# Patient Record
Sex: Male | Born: 1960 | Race: Black or African American | Hispanic: No | Smoking: Never smoker
Health system: Southern US, Community
[De-identification: ages and names within clinical notes are randomized; demographics above are authoritative.]

## PROBLEM LIST (undated history)

## (undated) DIAGNOSIS — T7840XA Allergy, unspecified, initial encounter: Secondary | ICD-10-CM

## (undated) HISTORY — DX: Allergy, unspecified, initial encounter: T78.40XA

## (undated) HISTORY — PX: WISDOM TOOTH EXTRACTION: SHX21

## (undated) HISTORY — PX: BICEPS TENDON REPAIR: SHX566

---

## 2006-07-14 ENCOUNTER — Ambulatory Visit: Payer: Self-pay | Admitting: Orthopaedic Surgery

## 2008-12-14 ENCOUNTER — Encounter: Admission: RE | Admit: 2008-12-14 | Discharge: 2008-12-14 | Payer: Self-pay | Admitting: Family Medicine

## 2010-10-08 IMAGING — CT CT EXTREM LOW W/O CM*R*
3 series · 7 of 33 positions shown, 9 images · non-contrast
Comparison: None available.

CLINICAL DATA: Hip pain and swelling.  Question avulsion fracture
of the hip.

CT RIGHT HIP WITHOUT CONTRAST
TECHNIQUE: Multidetector CT imaging of the right hip was performed
according to the standard protocol without intravenous contrast.
Multiplanar CT image reconstructions were also generated.

[Series 4: hip st · axial · 0.28mm/px · z∈[+1061,+1190]mm · 5 of 63 slices shown, 6 images]
[im 10/63  soft-tissue]
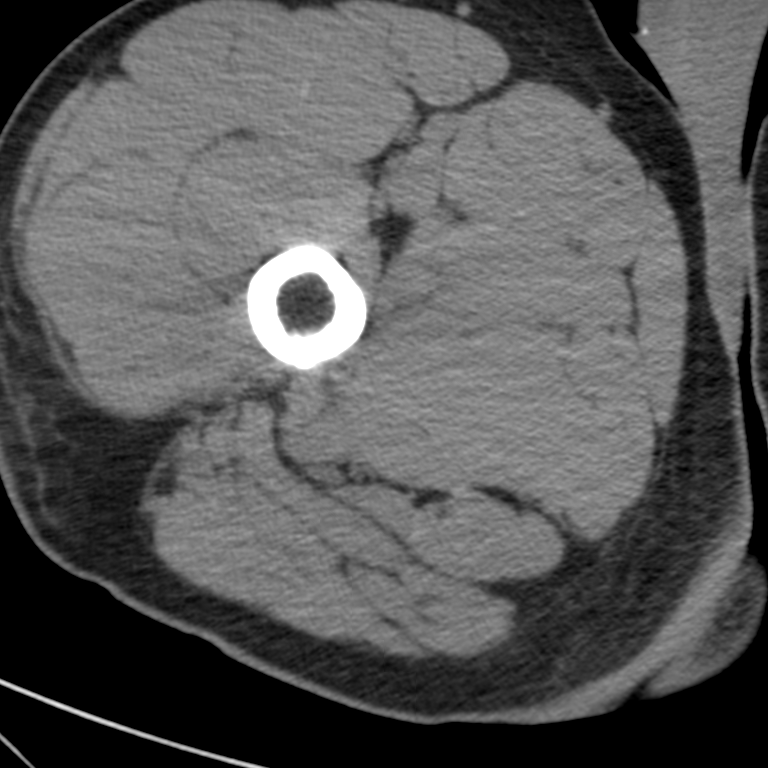
[im 10/63  bone]
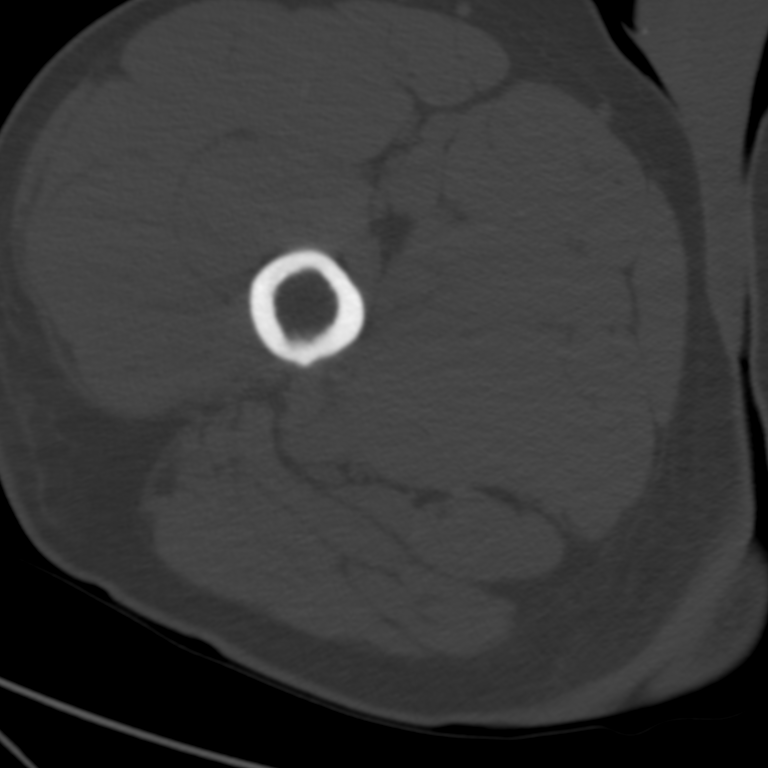
[im 20/63  soft-tissue]
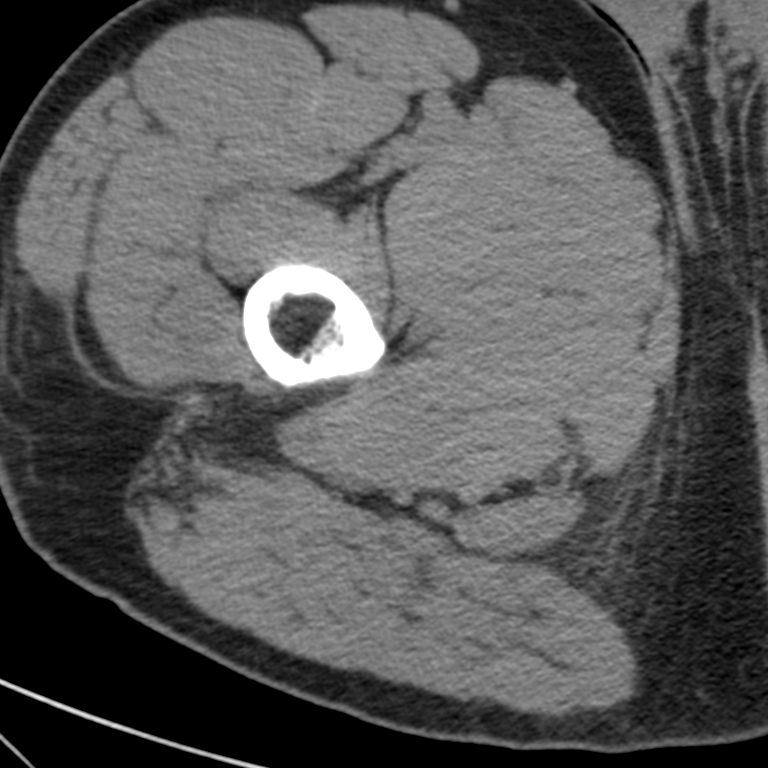
[im 34/63  soft-tissue]
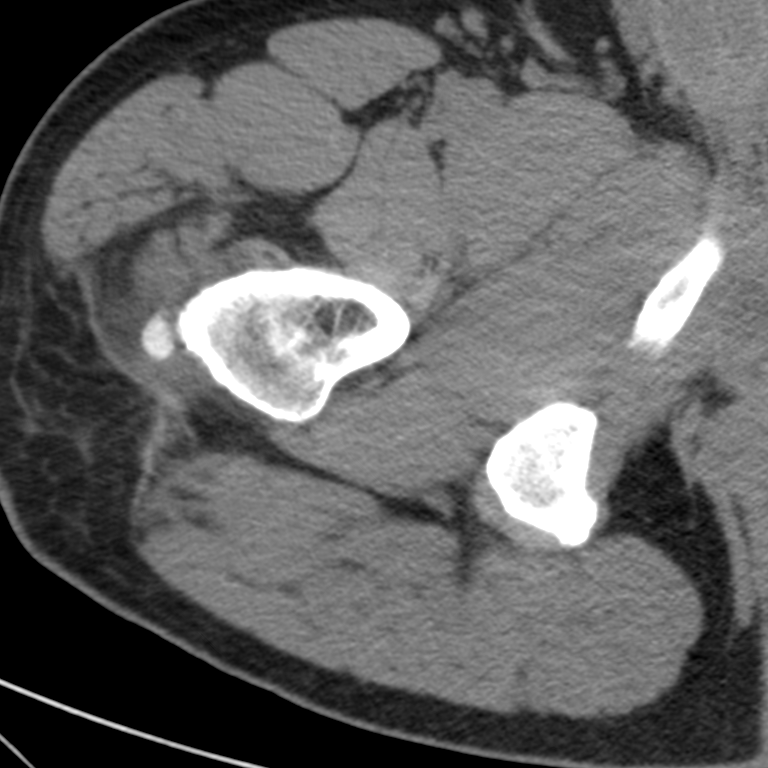
[im 43/63  soft-tissue]
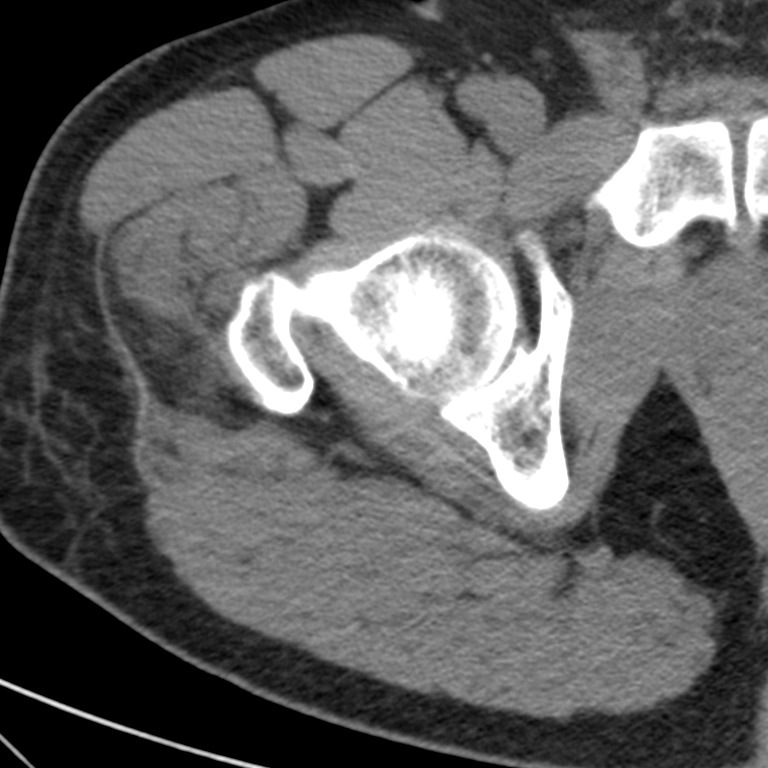
[im 53/63  soft-tissue]
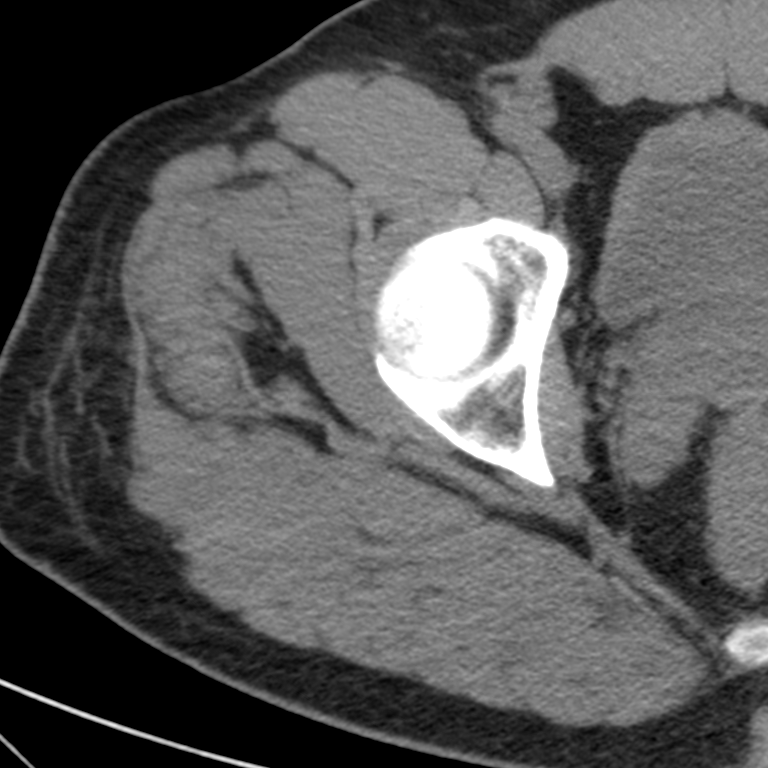

[coronals · coronal · 0.28mm/px · 1 of 62 slices shown]
[im 25/62  bone]
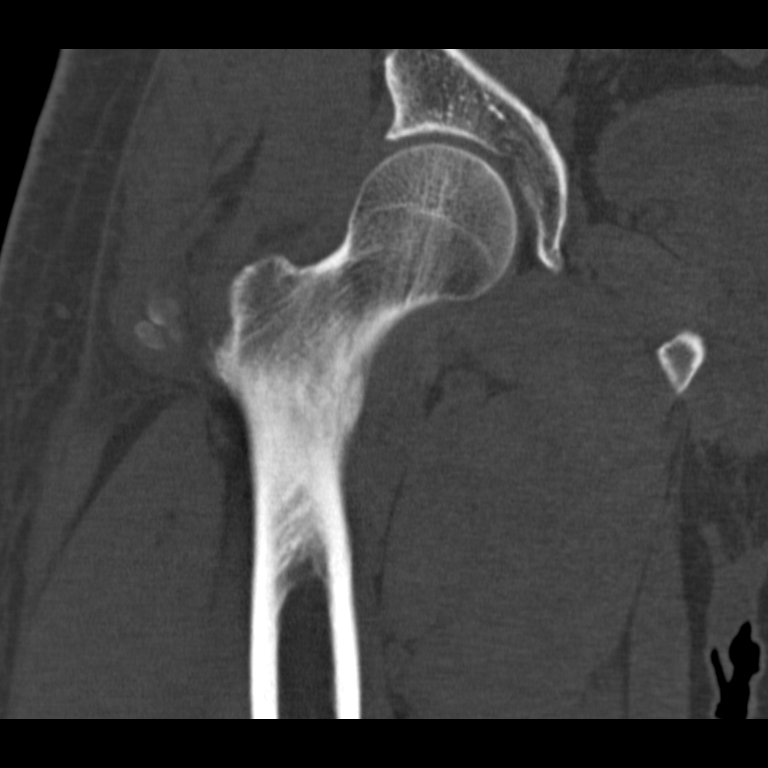

[sags · sagittal · 0.28mm/px · 1 of 86 slices shown, 2 images]
[im 43/86  soft-tissue]
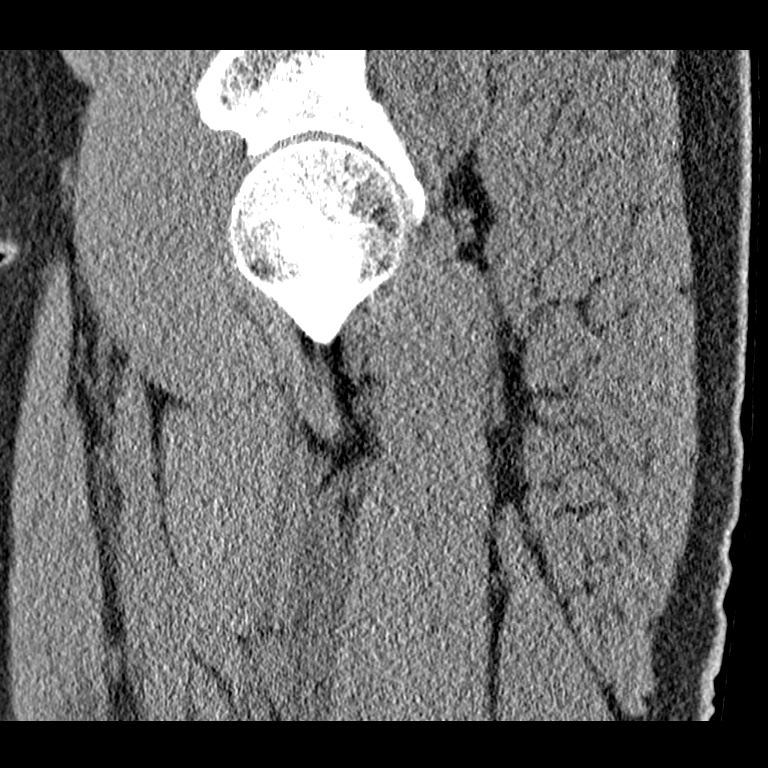
[im 43/86  bone]
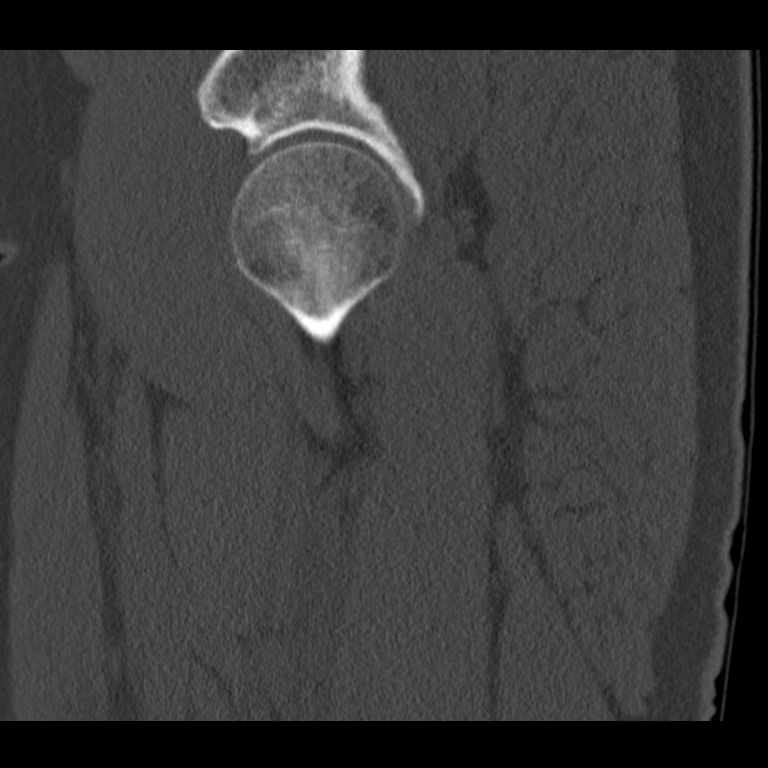

[7 of 33 positions shown; findings below may reference images not displayed]

FINDINGS: Calcifications are present lateral to the right greater
trochanter consistent with calcific bursitis.  There is no gluteal
muscle atrophy or evidence of full-thickness gluteal tendon tear
with retraction.  The iliopsoas muscles and external rotators of
the hip appear intact.  Piriformis is unremarkable.  The visceral
pelvis appears within normal limits.  Left spermatic cord lipoma
incidentally noted.  Mild pubic symphysial degenerative disease.
IMPRESSION: 1.  Calcific bursitis of the right hip greater trochanteric bursa.
2.  No convincing evidence of gluteal tendon tear on CT.  No
muscular atrophy is present.

## 2012-03-29 ENCOUNTER — Ambulatory Visit (INDEPENDENT_AMBULATORY_CARE_PROVIDER_SITE_OTHER): Payer: 59 | Admitting: Internal Medicine

## 2012-03-29 VITALS — BP 123/91 | HR 111 | Temp 102.6°F | Resp 18 | Ht 69.5 in | Wt 234.0 lb

## 2012-03-29 DIAGNOSIS — R05 Cough: Secondary | ICD-10-CM

## 2012-03-29 DIAGNOSIS — J111 Influenza due to unidentified influenza virus with other respiratory manifestations: Secondary | ICD-10-CM

## 2012-03-29 DIAGNOSIS — R509 Fever, unspecified: Secondary | ICD-10-CM

## 2012-03-29 LAB — POCT CBC
Hemoglobin: 15.7 g/dL (ref 14.1–18.1)
Lymph, poc: 1 (ref 0.6–3.4)
MCH, POC: 28.7 pg (ref 27–31.2)
MCV: 92.7 fL (ref 80–97)
MID (cbc): 0.5 (ref 0–0.9)
MPV: 9.2 fL (ref 0–99.8)
POC Granulocyte: 3.3 (ref 2–6.9)
POC MID %: 10.9 %M (ref 0–12)
Platelet Count, POC: 175 10*3/uL (ref 142–424)
RBC: 5.47 M/uL (ref 4.69–6.13)
WBC: 4.8 10*3/uL (ref 4.6–10.2)

## 2012-03-29 MED ORDER — PROMETHAZINE-DM 6.25-15 MG/5ML PO SYRP: 5.0000 mL | ORAL_SOLUTION | Freq: Four times a day (QID) | ORAL | Status: DC | PRN

## 2012-03-29 MED ORDER — OSELTAMIVIR PHOSPHATE 75 MG PO CAPS: 75.0000 mg | ORAL_CAPSULE | Freq: Two times a day (BID) | ORAL | Status: DC

## 2012-03-29 NOTE — Progress Notes (Signed)
  Subjective:    Patient ID: Erik Wheeler, male    DOB: 25-May-1960, 52 y.o.   MRN: [id number]  HPI24 h illness w/ fever, cough-nonprod, aches, chills, sweats No ST No N,V,D Appetite poor   Review of Systems     Objective:   Physical Exam BP 123/91  Pulse 111  Temp 102.6 F (39.2 C)  Resp 18  Ht 5' 9.5" (1.765 m)  Wt 234 lb (106.142 kg)  BMI 34.06 kg/m2  SpO2 97% NAD Tms clear Nares and throat clear Chest clear  Results for orders placed in visit on 03/29/12  POCT INFLUENZA A/B      Component Value Range   Influenza A, POC Negative     Influenza B, POC Negative    POCT CBC      Component Value Range   WBC 4.8  4.6 - 10.2 K/uL   Lymph, poc 1.0  0.6 - 3.4   POC LYMPH PERCENT 20.6  10 - 50 %L   MID (cbc) 0.5  0 - 0.9   POC MID % 10.9  0 - 12 %M   POC Granulocyte 3.3  2 - 6.9   Granulocyte percent 68.5  37 - 80 %G   RBC 5.47  4.69 - 6.13 M/uL   Hemoglobin 15.7  14.1 - 18.1 g/dL   HCT, POC 14.7  82.9 - 53.7 %   MCV 92.7  80 - 97 fL   MCH, POC 28.7  27 - 31.2 pg   MCHC 31.0 (*) 31.8 - 35.4 g/dL   RDW, POC 56.2     Platelet Count, POC 175  142 - 424 K/uL   MPV 9.2  0 - 99.8 fL        Assessment & Plan:   1. Fever  POCT Influenza A/B, POCT CBC  2. Influenza    3. Cough     Meds ordered this encounter  Medications  . oseltamivir (TAMIFLU) 75 MG capsule    Sig: Take 1 capsule (75 mg total) by mouth 2 (two) times daily.    Dispense:  10 capsule    Refill:  0  . promethazine-dextromethorphan (PROMETHAZINE-DM) 6.25-15 MG/5ML syrup    Sig: Take 5 mLs by mouth 4 (four) times daily as needed for cough.    Dispense:  120 mL    Refill:  0

## 2012-03-30 ENCOUNTER — Encounter: Payer: Self-pay | Admitting: Internal Medicine

## 2012-04-04 ENCOUNTER — Encounter: Payer: Self-pay | Admitting: Family Medicine

## 2012-04-04 ENCOUNTER — Ambulatory Visit (INDEPENDENT_AMBULATORY_CARE_PROVIDER_SITE_OTHER): Payer: 59 | Admitting: Family Medicine

## 2012-04-04 VITALS — BP 108/82 | HR 72 | Temp 98.7°F | Resp 16 | Ht 68.0 in | Wt 230.6 lb

## 2012-04-04 DIAGNOSIS — J029 Acute pharyngitis, unspecified: Secondary | ICD-10-CM

## 2012-04-04 DIAGNOSIS — I10 Essential (primary) hypertension: Secondary | ICD-10-CM

## 2012-04-04 DIAGNOSIS — Z23 Encounter for immunization: Secondary | ICD-10-CM

## 2012-04-04 DIAGNOSIS — R509 Fever, unspecified: Secondary | ICD-10-CM

## 2012-04-04 DIAGNOSIS — T7840XA Allergy, unspecified, initial encounter: Secondary | ICD-10-CM

## 2012-04-04 DIAGNOSIS — R21 Rash and other nonspecific skin eruption: Secondary | ICD-10-CM

## 2012-04-04 NOTE — Progress Notes (Signed)
Subjective:    Patient ID: Erik Wheeler, male    DOB: Oct 07, 1960, 52 y.o.   MRN: [id number]  HPI Erik Wheeler is a 52 y.o. malenoted  rx tamiflu and phenergan Dm on 1/19 for ILI, cough, fever, myalgias. Did not take cough syrup - only tamiflu - last dose yesterday.   Noted a rash - few days ago on side of L leg.  Spreading past 2 days, now on L back/chest, both arms.  None on face.  Min itching after bath.  No respiratory symptoms, no throat symptoms.  New bath soap - past 4-5 days. Usually uses [name], now using "[place] with lavendar.  No attempted treatments.   Review of Systems  Constitutional: Negative for fever, chills, activity change, appetite change and unexpected weight change.  HENT: Negative for sore throat, mouth sores and trouble swallowing.   Respiratory: Negative for cough, chest tightness and shortness of breath.   Cardiovascular: Negative for chest pain and leg swelling.  Musculoskeletal: Negative for myalgias and arthralgias.  Skin: Positive for color change and rash.  Hematological: Negative for adenopathy. Does not bruise/bleed easily.       Objective:   Physical Exam  Vitals reviewed. Constitutional: He is oriented to person, place, and time. He appears well-developed and well-nourished.  HENT:  Head: Normocephalic and atraumatic.  Right Ear: Tympanic membrane, external ear and ear canal normal.  Left Ear: Tympanic membrane, external ear and ear canal normal.  Nose: No rhinorrhea.  Mouth/Throat: Oropharynx is clear and moist and mucous membranes are normal. No oropharyngeal exudate or posterior oropharyngeal erythema.       No mucosal lesions.   Eyes: Conjunctivae normal are normal. Pupils are equal, round, and reactive to light.  Neck: Neck supple.  Cardiovascular: Normal rate, regular rhythm, normal heart sounds and intact distal pulses.   No murmur heard. Pulmonary/Chest: Effort normal and breath sounds normal. He has no wheezes. He has no rhonchi. He has no  rales.  Abdominal: Soft. There is no tenderness.  Lymphadenopathy:    He has no cervical adenopathy.  Neurological: He is alert and oriented to person, place, and time.  Skin: Skin is warm and dry.     Psychiatric: He has a normal mood and affect. His behavior is normal.          Assessment & Plan:  Erik Wheeler is a 52 y.o. male 1. Rash   2. Allergic reaction    Possible allergic rxn to soap or less likely Tamifliu, but few herald - appearing patches - ddx includes pityriaisis or other viral exanthem as not very pruritic.  Sx care for now, with rtc precautions as below.   Patient Instructions  This rash my be an allergic reaction or a viral syndrome - sometimes seen after a virus like you had.  You can take benadryl over the counter every 4-6 hours as needed, zantac 150mg  twice per day (another type of antihistamine), and switch back to [name] soap.  If any further spread of rash, or increased itching - may need to take prednisone - call or return to clinic to discuss.  If any mouth lesions, or worsening recheck here or the emergency room.   Return to the clinic or go to the nearest emergency room if any of your symptoms worsen or new symptoms occur.  Allergic Reaction, Mild to Moderate Allergies may happen from anything your body is sensitive to. This may be food, medications, pollens, chemicals, and nearly anything around you in everyday life  that produces allergens. An allergen is anything that causes an allergy producing substance. Allergens cause your body to release allergic antibodies. Through a chain of events, they cause a release of histamine into the blood stream. Histamines are meant to protect you, but they also cause your discomfort. This is why antihistamines are often used for allergies. Heredity is often a factor in causing allergic reactions. This means you may have some of the same allergies as your parents. Allergies happen in all age groups. You may have some idea of what  caused your reaction. There are many allergens around [place]. It may be difficult to know what caused your reaction. If this is a first time event, it may never happen again. Allergies cannot be cured but can be controlled with medications. SYMPTOMS  You may get some or all of the following problems from allergies.  Swelling and itching in and around the mouth.   Tearing, itchy eyes.   Nasal congestion and runny nose.   Sneezing and coughing.   An itchy red rash or hives.   Vomiting or diarrhea.   Difficulty breathing.  Seasonal allergies occur in all age groups. They are seasonal because they usually occur during the same season every year. They may be a reaction to molds, grass pollens, or tree pollens. Other causes of allergies are house dust mite allergens, pet dander and mold spores. These are just a common few of the thousands of allergens around [place]. All of the symptoms listed above happen when you come in contact with pollens and other allergens. Seasonal allergies are usually not life threatening. They are generally more of a nuisance that can often be handled using medications. Hay fever is a combination of all or some of the above listed allergy problems. It may often be treated with simple over-the-counter medications such as diphenhydramine. Take medication as directed. Check with your caregiver or package insert for child dosages. TREATMENT AND HOME CARE INSTRUCTIONS If hives or rash are present:  Take medications as directed.   You may use an over-the-counter antihistamine (diphenhydramine) for hives and itching as needed. Do not drive or drink alcohol until medications used to treat the reaction have worn off. Antihistamines tend to make people sleepy.   Apply cold cloths (compresses) to the skin or take baths in cool water. This will help itching. Avoid hot baths or showers. Heat will make a rash and itching worse.   If your allergies persist and become more severe, and over  the counter medications are not effective, there are many new medications your caretaker can prescribe. Immunotherapy or desensitizing injections can be used if all else fails. Follow up with your caregiver if problems continue.  SEEK MEDICAL CARE IF:   Your allergies are becoming progressively more troublesome.   You suspect a food allergy. Symptoms generally happen within 30 minutes of eating a food.   Your symptoms have not gone away within 2 days or are getting worse.   You develop new symptoms.   You want to retest yourself or your child with a food or drink you think causes an allergic reaction. Never test yourself or your child of a suspected allergy without being under the watchful eye of your caregivers. A second exposure to an allergen may be life-threatening.  SEEK IMMEDIATE MEDICAL CARE IF:  You develop difficulty breathing or wheezing, or have a tight feeling in your chest or throat.   You develop a swollen mouth, hives, swelling, or itching all over your  body.  A severe reaction with any of the above problems should be considered life-threatening. If you suddenly develop difficulty breathing call for local emergency medical help. THIS IS AN EMERGENCY. MAKE SURE YOU:   Understand these instructions.   Will watch your condition.   Will get help right away if you are not doing well or get worse.  Document Released: 12/23/2006 Document Revised: 02/14/2011 Document Reviewed: 12/23/2006 [facility] Patient Information 2012 [place], [place].

## 2012-04-04 NOTE — Patient Instructions (Signed)
This rash my be an allergic reaction or a viral syndrome - sometimes seen after a virus like you had.  You can take benadryl over the counter every 4-6 hours as needed, zantac 150mg  twice per day (another type of antihistamine), and switch back to [name] soap.  If any further spread of rash, or increased itching - may need to take prednisone - call or return to clinic to discuss.  If any mouth lesions, or worsening recheck here or the emergency room.   Return to the clinic or go to the nearest emergency room if any of your symptoms worsen or new symptoms occur.  Allergic Reaction, Mild to Moderate Allergies may happen from anything your body is sensitive to. This may be food, medications, pollens, chemicals, and nearly anything around you in everyday life that produces allergens. An allergen is anything that causes an allergy producing substance. Allergens cause your body to release allergic antibodies. Through a chain of events, they cause a release of histamine into the blood stream. Histamines are meant to protect you, but they also cause your discomfort. This is why antihistamines are often used for allergies. Heredity is often a factor in causing allergic reactions. This means you may have some of the same allergies as your parents. Allergies happen in all age groups. You may have some idea of what caused your reaction. There are many allergens around [place]. It may be difficult to know what caused your reaction. If this is a first time event, it may never happen again. Allergies cannot be cured but can be controlled with medications. SYMPTOMS  You may get some or all of the following problems from allergies.  Swelling and itching in and around the mouth.   Tearing, itchy eyes.   Nasal congestion and runny nose.   Sneezing and coughing.   An itchy red rash or hives.   Vomiting or diarrhea.   Difficulty breathing.  Seasonal allergies occur in all age groups. They are seasonal because they usually  occur during the same season every year. They may be a reaction to molds, grass pollens, or tree pollens. Other causes of allergies are house dust mite allergens, pet dander and mold spores. These are just a common few of the thousands of allergens around [place]. All of the symptoms listed above happen when you come in contact with pollens and other allergens. Seasonal allergies are usually not life threatening. They are generally more of a nuisance that can often be handled using medications. Hay fever is a combination of all or some of the above listed allergy problems. It may often be treated with simple over-the-counter medications such as diphenhydramine. Take medication as directed. Check with your caregiver or package insert for child dosages. TREATMENT AND HOME CARE INSTRUCTIONS If hives or rash are present:  Take medications as directed.   You may use an over-the-counter antihistamine (diphenhydramine) for hives and itching as needed. Do not drive or drink alcohol until medications used to treat the reaction have worn off. Antihistamines tend to make people sleepy.   Apply cold cloths (compresses) to the skin or take baths in cool water. This will help itching. Avoid hot baths or showers. Heat will make a rash and itching worse.   If your allergies persist and become more severe, and over the counter medications are not effective, there are many new medications your caretaker can prescribe. Immunotherapy or desensitizing injections can be used if all else fails. Follow up with your caregiver if problems continue.  SEEK MEDICAL CARE IF:   Your allergies are becoming progressively more troublesome.   You suspect a food allergy. Symptoms generally happen within 30 minutes of eating a food.   Your symptoms have not gone away within 2 days or are getting worse.   You develop new symptoms.   You want to retest yourself or your child with a food or drink you think causes an allergic reaction.  Never test yourself or your child of a suspected allergy without being under the watchful eye of your caregivers. A second exposure to an allergen may be life-threatening.  SEEK IMMEDIATE MEDICAL CARE IF:  You develop difficulty breathing or wheezing, or have a tight feeling in your chest or throat.   You develop a swollen mouth, hives, swelling, or itching all over your body.  A severe reaction with any of the above problems should be considered life-threatening. If you suddenly develop difficulty breathing call for local emergency medical help. THIS IS AN EMERGENCY. MAKE SURE YOU:   Understand these instructions.   Will watch your condition.   Will get help right away if you are not doing well or get worse.  Document Released: 12/23/2006 Document Revised: 02/14/2011 Document Reviewed: 12/23/2006 [facility] Patient Information 2012 [place], [place].

## 2013-11-17 ENCOUNTER — Ambulatory Visit (INDEPENDENT_AMBULATORY_CARE_PROVIDER_SITE_OTHER): Payer: 59 | Admitting: Family Medicine

## 2013-11-17 ENCOUNTER — Ambulatory Visit (INDEPENDENT_AMBULATORY_CARE_PROVIDER_SITE_OTHER): Payer: 59

## 2013-11-17 VITALS — BP 112/70 | HR 63 | Temp 98.0°F | Resp 16 | Ht 69.0 in | Wt 237.0 lb

## 2013-11-17 DIAGNOSIS — T148XXA Other injury of unspecified body region, initial encounter: Secondary | ICD-10-CM

## 2013-11-17 DIAGNOSIS — R1032 Left lower quadrant pain: Secondary | ICD-10-CM

## 2013-11-17 DIAGNOSIS — R109 Unspecified abdominal pain: Secondary | ICD-10-CM

## 2013-11-17 MED ORDER — CYCLOBENZAPRINE HCL 5 MG PO TABS: 5.0000 mg | ORAL_TABLET | Freq: Every evening | ORAL | Status: DC | PRN

## 2013-11-17 NOTE — Progress Notes (Signed)
Chief Complaint:  Chief Complaint  Patient presents with  . Groin Pain    this morning pt was lifting something at the gym and hurt his groin on the left side    HPI: Erik Wheeler is a 53 y.o. male who is here for  Acute left groin pain after doing squats with 130 lbs of weight on bar and heard a pop when he was going down. He was fine prior to this and has done weights and used to working with 300 lbs weights. There is no swelling but he has pain in the left groin area and he has difficult moving his leg on its own, he needs to lift his leg up to move it , hard for him to sit. He has no uriantion problems. He does not have any abd pain, nausea or vomiting. He had problems getting out of the car since had to pull his leg up to help himself get out. It is achey, constant, when he is moving it is 8/10 when he is trying to get out of car. He has discomofrt when he is sitting. He has not tried anything for this. No history of inguinal hernias. He has no steroid or fluoroquinolone/abx use. He has no numbness or tingling. Has been using Alive vitamin, this week he took saw palmetto and fish oil.  No urinary incontinence. He had biceps tendon repair in [place], similar feeling to 6-7 years ago. Again no steroid use or autoimmune disease   Past Medical History  Diagnosis Date  . Allergy    Past Surgical History  Procedure Laterality Date  . Wisdom tooth extraction    . Biceps tendon repair     History   Social History  . Marital Status: Unknown    Spouse Name: N/A    Number of Children: N/A  . Years of Education: N/A   Social History Main Topics  . Smoking status: Never Smoker   . Smokeless tobacco: None  . Alcohol Use: No  . Drug Use: No  . Sexual Activity: Yes   Other Topics Concern  . None   Social History Narrative  . None   History reviewed. No pertinent family history. Allergies  Allergen Reactions  . Amoxicillin Other (See Comments)    Pt does not remember  .  Vicodin [Hydrocodone-Acetaminophen]    Prior to Admission medications   Not on File     ROS: The patient denies fevers, chills, night sweats, unintentional weight loss, chest pain, palpitations, wheezing, dyspnea on exertion, nausea, vomiting, abdominal pain, dysuria, hematuria, melena, numbness, weakness, or tingling.   All other systems have been reviewed and were otherwise negative with the exception of those mentioned in the HPI and as above.    PHYSICAL EXAM: Filed Vitals:   11/17/13 1741  BP: 112/70  Pulse: 63  Temp: 98 F (36.7 C)  Resp: 16   Filed Vitals:   11/17/13 1741  Height:  (1.753 m)  Weight: 237 lb (107.502 kg)   Body mass index is 34.98 kg/(m^2).  General: Alert, no acute distress HEENT:  Normocephalic, atraumatic, oropharynx patent. EOMI, PERRLA Cardiovascular:  Regular rate and rhythm, no rubs murmurs or gallops.  No Carotid bruits, radial pulse intact. No pedal edema.  Respiratory: Clear to auscultation bilaterally.  No wheezes, rales, or rhonchi.  No cyanosis, no use of accessory musculature GI: No organomegaly, abdomen is soft and non-tender, positive bowel sounds.  No masses. Skin: No rashes. Neurologic: Facial  musculature symmetric. Psychiatric: Patient is appropriate throughout our interaction. Lymphatic: No cervical lymphadenopathy Musculoskeletal: Gait antalgic 5/5 strength Decrease ROM in adduction and abduction of left quad He has full sensation 2/2 ankle and knee reflexes Straight leg neg Neg saddle anesthesia   LABS: Results for orders placed in visit on 03/29/12  POCT INFLUENZA A/B      Result Value Ref Range   Influenza A, POC Negative     Influenza B, POC Negative    POCT CBC      Result Value Ref Range   WBC 4.8  4.6 - 10.2 K/uL   Lymph, poc 1.0  0.6 - 3.4   POC LYMPH PERCENT 20.6  10 - 50 %L   MID (cbc) 0.5  0 - 0.9   POC MID % 10.9  0 - 12 %M   POC Granulocyte 3.3  2 - 6.9   Granulocyte percent 68.5  37 - 80 %G   RBC  5.47  4.69 - 6.13 M/uL   Hemoglobin 15.7  14.1 - 18.1 g/dL   HCT, POC 40.9  81.1 - 53.7 %   MCV 92.7  80 - 97 fL   MCH, POC 28.7  27 - 31.2 pg   MCHC 31.0 (*) 31.8 - 35.4 g/dL   RDW, POC 91.4     Platelet Count, POC 175  142 - 424 K/uL   MPV 9.2  0 - 99.8 fL     EKG/XRAY:   Primary read interpreted by Dr. Conley Rolls at [facility]. Neg for fracture or dislocation   ASSESSMENT/PLAN: Encounter Diagnoses  Name Primary?  . Groin pain, left Yes  . Sprain and strain    He has a slight reducible left inguinal hernia but he has msk pain with adduction and abduction ? Vastus medialis strain/sprain/msk contusion Ibuprofen prn, decline stronger pain meds Refer to ortho per pt request, he would like to be seen this week since he had similar sxs with biceps tendon rupture, refer to whoever can see him at [facility] if not then whoever is available F/u prn   Gross sideeffects, risk and benefits, and alternatives of medications d/w patient. Patient is aware that all medications have potential sideeffects and we are unable to predict every sideeffect or drug-drug interaction that may occur.  Hamilton Capri PHUONG, DO 11/17/2013 7:21 PM

## 2015-09-11 IMAGING — CR DG PELVIS 1-2V
1 series · 1 of 1 positions shown · non-contrast
Comparison: None.

CLINICAL DATA: Acute left groin pain after injury.

EXAM:
PELVIS - 1-2 VIEW

[AP]
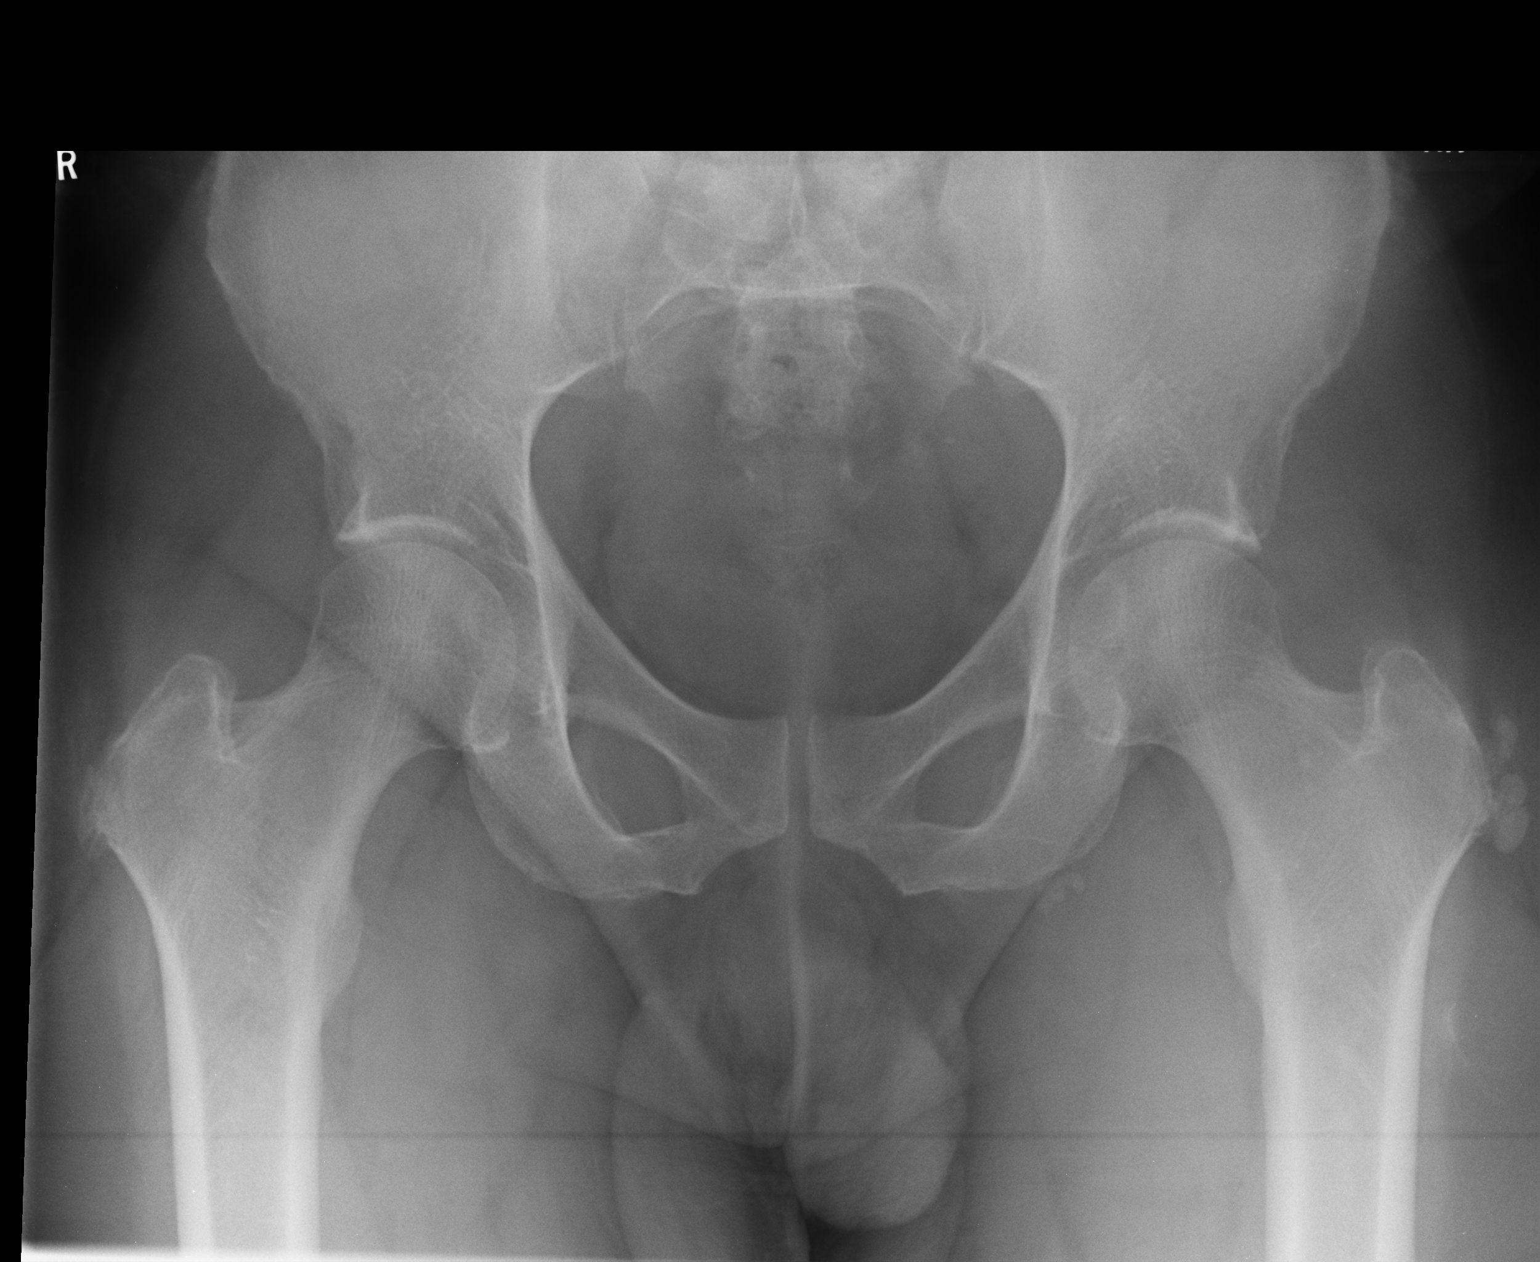

[1 of 1 positions shown; findings below may reference images not displayed]

FINDINGS: Mild degenerative changes in both hips. Heterotopic calcification or
on the left greater trochanter. No fracture or dislocation of the
pelvis or hips. SI joints and symphysis pubis are intact.
IMPRESSION: No acute bony abnormalities.

## 2017-12-18 DIAGNOSIS — R229 Localized swelling, mass and lump, unspecified: Secondary | ICD-10-CM | POA: Diagnosis not present

## 2019-01-30 DIAGNOSIS — I959 Hypotension, unspecified: Secondary | ICD-10-CM | POA: Diagnosis not present

## 2019-01-30 DIAGNOSIS — R55 Syncope and collapse: Secondary | ICD-10-CM | POA: Diagnosis not present

## 2019-02-11 DIAGNOSIS — S76211A Strain of adductor muscle, fascia and tendon of right thigh, initial encounter: Secondary | ICD-10-CM | POA: Diagnosis not present

## 2019-03-11 DIAGNOSIS — H40033 Anatomical narrow angle, bilateral: Secondary | ICD-10-CM | POA: Diagnosis not present

## 2019-03-11 DIAGNOSIS — H04123 Dry eye syndrome of bilateral lacrimal glands: Secondary | ICD-10-CM | POA: Diagnosis not present

## 2019-04-07 ENCOUNTER — Ambulatory Visit: Payer: BC Managed Care – PPO | Attending: Internal Medicine

## 2019-04-07 DIAGNOSIS — Z20822 Contact with and (suspected) exposure to covid-19: Secondary | ICD-10-CM

## 2019-04-08 LAB — NOVEL CORONAVIRUS, NAA: SARS-CoV-2, NAA: DETECTED — AB

## 2019-07-12 DIAGNOSIS — R070 Pain in throat: Secondary | ICD-10-CM | POA: Diagnosis not present

## 2019-07-12 DIAGNOSIS — R590 Localized enlarged lymph nodes: Secondary | ICD-10-CM | POA: Diagnosis not present

## 2019-09-21 DIAGNOSIS — Z0000 Encounter for general adult medical examination without abnormal findings: Secondary | ICD-10-CM | POA: Diagnosis not present

## 2019-09-21 DIAGNOSIS — Z125 Encounter for screening for malignant neoplasm of prostate: Secondary | ICD-10-CM | POA: Diagnosis not present

## 2019-09-21 DIAGNOSIS — E669 Obesity, unspecified: Secondary | ICD-10-CM | POA: Diagnosis not present

## 2019-09-21 DIAGNOSIS — Z13220 Encounter for screening for lipoid disorders: Secondary | ICD-10-CM | POA: Diagnosis not present

## 2020-10-06 DIAGNOSIS — E7800 Pure hypercholesterolemia, unspecified: Secondary | ICD-10-CM | POA: Diagnosis not present

## 2020-10-06 DIAGNOSIS — Z0000 Encounter for general adult medical examination without abnormal findings: Secondary | ICD-10-CM | POA: Diagnosis not present

## 2020-10-06 DIAGNOSIS — Z125 Encounter for screening for malignant neoplasm of prostate: Secondary | ICD-10-CM | POA: Diagnosis not present

## 2020-10-06 DIAGNOSIS — Z23 Encounter for immunization: Secondary | ICD-10-CM | POA: Diagnosis not present

## 2020-12-29 DIAGNOSIS — Z23 Encounter for immunization: Secondary | ICD-10-CM | POA: Diagnosis not present

## 2021-11-22 ENCOUNTER — Emergency Department (HOSPITAL_COMMUNITY): Payer: 59

## 2021-11-22 ENCOUNTER — Encounter (HOSPITAL_COMMUNITY): Payer: Self-pay | Admitting: Emergency Medicine

## 2021-11-22 ENCOUNTER — Emergency Department (HOSPITAL_COMMUNITY)
Admission: EM | Admit: 2021-11-22 | Discharge: 2021-11-22 | Disposition: A | Discharge status: Home / Self Care | Payer: 59 | Attending: Emergency Medicine | Admitting: Emergency Medicine

## 2021-11-22 DIAGNOSIS — Z20822 Contact with and (suspected) exposure to covid-19: Secondary | ICD-10-CM | POA: Diagnosis not present

## 2021-11-22 DIAGNOSIS — R531 Weakness: Secondary | ICD-10-CM | POA: Insufficient documentation

## 2021-11-22 DIAGNOSIS — R000 Tachycardia, unspecified: Secondary | ICD-10-CM | POA: Insufficient documentation

## 2021-11-22 DIAGNOSIS — N3001 Acute cystitis with hematuria: Secondary | ICD-10-CM

## 2021-11-22 DIAGNOSIS — R509 Fever, unspecified: Secondary | ICD-10-CM | POA: Diagnosis present

## 2021-11-22 LAB — COMPREHENSIVE METABOLIC PANEL
ALT: 44 U/L (ref 0–44)
AST: 45 U/L — ABNORMAL HIGH (ref 15–41)
Albumin: 3.5 g/dL (ref 3.5–5.0)
Alkaline Phosphatase: 58 U/L (ref 38–126)
Anion gap: 8 (ref 5–15)
BUN: 34 mg/dL — ABNORMAL HIGH (ref 8–23)
CO2: 23 mmol/L (ref 22–32)
Calcium: 9.3 mg/dL (ref 8.9–10.3)
Chloride: 101 mmol/L (ref 98–111)
Creatinine, Ser: 1.7 mg/dL — ABNORMAL HIGH (ref 0.61–1.24)
GFR, Estimated: 45 mL/min — ABNORMAL LOW (ref >60)
Glucose, Bld: 124 mg/dL — ABNORMAL HIGH (ref 70–99)
Potassium: 4.4 mmol/L (ref 3.5–5.1)
Sodium: 132 mmol/L — ABNORMAL LOW (ref 135–145)
Total Bilirubin: 1.6 mg/dL — ABNORMAL HIGH (ref 0.3–1.2)
Total Protein: 8.3 g/dL — ABNORMAL HIGH (ref 6.5–8.1)

## 2021-11-22 LAB — URINALYSIS, ROUTINE W REFLEX MICROSCOPIC
Bilirubin Urine: NEGATIVE
Glucose, UA: NEGATIVE mg/dL
Ketones, ur: NEGATIVE mg/dL
Nitrite: POSITIVE — AB
Protein, ur: 100 mg/dL — AB
RBC / HPF: >50 RBC/hpf — ABNORMAL HIGH (ref 0–5)
Specific Gravity, Urine: 1.02 (ref 1.005–1.030)
WBC, UA: >50 WBC/hpf — ABNORMAL HIGH (ref 0–5)
pH: 5 (ref 5.0–8.0)

## 2021-11-22 LAB — RESP PANEL BY RT-PCR (FLU A&B, COVID) ARPGX2
Influenza A by PCR: NEGATIVE
Influenza B by PCR: NEGATIVE
SARS Coronavirus 2 by RT PCR: NEGATIVE

## 2021-11-22 LAB — CBC WITH DIFFERENTIAL/PLATELET
Abs Immature Granulocytes: 0.05 10*3/uL (ref 0.00–0.07)
Basophils Absolute: 0 10*3/uL (ref 0.0–0.1)
Basophils Relative: 0 %
Eosinophils Absolute: 0 10*3/uL (ref 0.0–0.5)
Eosinophils Relative: 0 %
HCT: 49.7 % (ref 39.0–52.0)
Hemoglobin: 16.5 g/dL (ref 13.0–17.0)
Immature Granulocytes: 1 %
Lymphocytes Relative: 8 %
Lymphs Abs: 0.7 10*3/uL (ref 0.7–4.0)
MCH: 29.8 pg (ref 26.0–34.0)
MCHC: 33.2 g/dL (ref 30.0–36.0)
MCV: 89.9 fL (ref 80.0–100.0)
Monocytes Absolute: 1.4 10*3/uL — ABNORMAL HIGH (ref 0.1–1.0)
Monocytes Relative: 15 %
Neutro Abs: 7.2 10*3/uL (ref 1.7–7.7)
Neutrophils Relative %: 76 %
Platelets: 106 10*3/uL — ABNORMAL LOW (ref 150–400)
RBC: 5.53 MIL/uL (ref 4.22–5.81)
RDW: 16.2 % — ABNORMAL HIGH (ref 11.5–15.5)
WBC: 9.4 10*3/uL (ref 4.0–10.5)
nRBC: 0 % (ref 0.0–0.2)

## 2021-11-22 LAB — CBG MONITORING, ED: Glucose-Capillary: 100 mg/dL — ABNORMAL HIGH (ref 70–99)

## 2021-11-22 LAB — LIPASE, BLOOD: Lipase: 30 U/L (ref 11–51)

## 2021-11-22 MED ORDER — ACETAMINOPHEN 500 MG PO TABS
1000.0000 mg | ORAL_TABLET | Freq: Once | ORAL | Status: AC
  Administered 2021-11-22: 1000 mg via ORAL
  Filled 2021-11-22: qty 2

## 2021-11-22 MED ORDER — SODIUM CHLORIDE 0.9 % IV BOLUS
1000.0000 mL | Freq: Once | INTRAVENOUS | Status: AC
  Administered 2021-11-22: 1000 mL via INTRAVENOUS

## 2021-11-22 MED ORDER — CEFDINIR 300 MG PO CAPS: 300.0000 mg | ORAL_CAPSULE | Freq: Two times a day (BID) | ORAL | 0 refills | Status: AC

## 2021-11-22 MED ORDER — SODIUM CHLORIDE 0.9 % IV SOLN
1.0000 g | Freq: Once | INTRAVENOUS | Status: AC
  Administered 2021-11-22: 1 g via INTRAVENOUS
  Filled 2021-11-22: qty 10

## 2021-11-22 NOTE — Discharge Instructions (Addendum)
We evaluated you in the emergency department for your weakness.  You had a fever in the emergency department and your work-up demonstrated signs of a urinary infection.  You received IV antibiotics and fluids in the emergency department.  Your lab tests also showed a signs of dehydration, your creatinine (a lab test) was 1.7 which is elevated and shows decreased kidney function.  A normal creatinine is less than 1.  Please drink a lot of fluids and follow-up with your primary doctor to get this rechecked.  Please return to the emergency department if you develop any new or worsening symptoms such as severe pain, fevers, lightheadedness or dizziness, increased weakness, back pain, or any other new symptoms.

## 2021-11-22 NOTE — ED Notes (Signed)
Still waiting for IV 

## 2021-11-22 NOTE — ED Notes (Signed)
Pt needs IV for CT  

## 2021-11-22 NOTE — ED Provider Notes (Signed)
[facility]-EMERGENCY DEPT Provider Note  CSN: [id number] Arrival date & time: 11/22/21 1305  Chief Complaint(s) Weakness  HPI Erik Wheeler is a 61 y.o. male without significant past medical history presenting to the emergency department with general fatigue.  He reports fatigue since Monday.  Reports that this is worsened.  Reports fevers at home as well as chills.  He is nausea and vomiting yesterday, since resolved.  Denies any other symptoms such as chest pain, abdominal pain, headache, numbness or tingling, back pain, dysuria, flank pain, rashes.  He went to an urgent care yesterday and was prescribed metoprolol because his heart rate was elevated but they reportedly did not find anything else wrong with him.   Past Medical History Past Medical History:  Diagnosis Date   Allergy    There are no problems to display for this patient.  Home Medication(s) Prior to Admission medications   Medication Sig Start Date End Date Taking? Authorizing Provider  cyclobenzaprine (FLEXERIL) 5 MG tablet Take 1 tablet (5 mg total) by mouth at bedtime as needed. 11/17/13   Lenell Antu, DO                                                                                                                                    Past Surgical History Past Surgical History:  Procedure Laterality Date   BICEPS TENDON REPAIR     WISDOM TOOTH EXTRACTION     Family History History reviewed. No pertinent family history.  Social History Social History   Tobacco Use   Smoking status: Never  Substance Use Topics   Alcohol use: No   Drug use: No   Allergies Amoxicillin and Vicodin [hydrocodone-acetaminophen]  Review of Systems Review of Systems  All other systems reviewed and are negative.   Physical Exam Vital Signs  I have reviewed the triage vital signs BP 100/67   Pulse (!) 111   Temp (!) 102.2 F (39 C) (Oral)   Resp 18   SpO2 92%  Physical Exam Vitals and nursing note  reviewed.  Constitutional:      General: He is not in acute distress.    Appearance: Normal appearance.  HENT:     Mouth/Throat:     Mouth: Mucous membranes are moist.  Eyes:     Conjunctiva/sclera: Conjunctivae normal.  Cardiovascular:     Rate and Rhythm: Regular rhythm. Tachycardia present.  Pulmonary:     Effort: Pulmonary effort is normal. No respiratory distress.     Breath sounds: Normal breath sounds.  Abdominal:     General: Abdomen is flat.     Palpations: Abdomen is soft.     Tenderness: There is no abdominal tenderness.  Musculoskeletal:     Right lower leg: No edema.     Left lower leg: No edema.  Skin:    General: Skin is warm and dry.     Capillary Refill: Capillary  refill takes less than 2 seconds.  Neurological:     Mental Status: He is alert and oriented to person, place, and time. Mental status is at baseline.  Psychiatric:        Mood and Affect: Mood normal.        Behavior: Behavior normal.     ED Results and Treatments Labs (all labs ordered are listed, but only abnormal results are displayed) Labs Reviewed  COMPREHENSIVE METABOLIC PANEL - Abnormal; Notable for the following components:      Result Value   Sodium 132 (*)    Glucose, Bld 124 (*)    BUN 34 (*)    Creatinine, Ser 1.70 (*)    Total Protein 8.3 (*)    AST 45 (*)    Total Bilirubin 1.6 (*)    GFR, Estimated 45 (*)    All other components within normal limits  CBC WITH DIFFERENTIAL/PLATELET - Abnormal; Notable for the following components:   RDW 16.2 (*)    Platelets 106 (*)    Monocytes Absolute 1.4 (*)    All other components within normal limits  URINALYSIS, ROUTINE W REFLEX MICROSCOPIC - Abnormal; Notable for the following components:   Color, Urine AMBER (*)    APPearance CLOUDY (*)    Hgb urine dipstick LARGE (*)    Protein, ur 100 (*)    Nitrite POSITIVE (*)    Leukocytes,Ua LARGE (*)    RBC / HPF >50 (*)    WBC, UA >50 (*)    Bacteria, UA MANY (*)    Non Squamous  Epithelial 0-5 (*)    All other components within normal limits  CBG MONITORING, ED - Abnormal; Notable for the following components:   Glucose-Capillary 100 (*)    All other components within normal limits  RESP PANEL BY RT-PCR (FLU A&B, COVID) ARPGX2  URINE CULTURE  LIPASE, BLOOD                                                                                                                          Radiology No results found.  Pertinent labs & imaging results that were available during my care of the patient were reviewed by me and considered in my medical decision making (see MDM for details).  Medications Ordered in ED Medications  sodium chloride 0.9 % bolus 1,000 mL (has no administration in time range)  cefTRIAXone (ROCEPHIN) 1 g in sodium chloride 0.9 % 100 mL IVPB (has no administration in time range)  acetaminophen (TYLENOL) tablet 1,000 mg (1,000 mg Oral Given 11/22/21 1359)  Procedures Procedures  (including critical care time)  Medical Decision Making / ED Course   MDM:  61 year old male presenting to the emergency department with weakness.  Patient overall well-appearing.  Febrile in the emergency department with tachycardia.  Differential included electrolyte abnormality, toxic or metabolic causes, occult infection, tachyarrhythmia.  Lab work notable for urinalysis which is very concerning for urinary infection.  Patient does report history of urinary infection years in the past.  No CVA tenderness to suggest pyelonephritis but does have what WBC clumps.  No pain to suggest nephrolithiasis, but will obtain CT scan to evaluate for this or signs of pyelonephritis.  Will give fluids, ceftriaxone.  Will give Tylenol.  Electrolytes also notable for possible AKI, creatinine of 1.7 with unclear baseline.  Given patient overall appears well,  vital signs improve after fluids, Tylenol, IV antibiotics patient may be able to be discharged.  Will send urine culture      Additional history obtained: -Additional history obtained from spouse -External records from outside source obtained and reviewed including: Chart review including previous notes, labs, imaging, consultation notes   Lab Tests: -I ordered, reviewed, and interpreted labs.   The pertinent results include:   Labs Reviewed  COMPREHENSIVE METABOLIC PANEL - Abnormal; Notable for the following components:      Result Value   Sodium 132 (*)    Glucose, Bld 124 (*)    BUN 34 (*)    Creatinine, Ser 1.70 (*)    Total Protein 8.3 (*)    AST 45 (*)    Total Bilirubin 1.6 (*)    GFR, Estimated 45 (*)    All other components within normal limits  CBC WITH DIFFERENTIAL/PLATELET - Abnormal; Notable for the following components:   RDW 16.2 (*)    Platelets 106 (*)    Monocytes Absolute 1.4 (*)    All other components within normal limits  URINALYSIS, ROUTINE W REFLEX MICROSCOPIC - Abnormal; Notable for the following components:   Color, Urine AMBER (*)    APPearance CLOUDY (*)    Hgb urine dipstick LARGE (*)    Protein, ur 100 (*)    Nitrite POSITIVE (*)    Leukocytes,Ua LARGE (*)    RBC / HPF >50 (*)    WBC, UA >50 (*)    Bacteria, UA MANY (*)    Non Squamous Epithelial 0-5 (*)    All other components within normal limits  CBG MONITORING, ED - Abnormal; Notable for the following components:   Glucose-Capillary 100 (*)    All other components within normal limits  RESP PANEL BY RT-PCR (FLU A&B, COVID) ARPGX2  URINE CULTURE  LIPASE, BLOOD      EKG   EKG Interpretation  Date/Time:  Thursday November 22 2021 13:34:27 EDT Ventricular Rate:  126 PR Interval:  146 QRS Duration: 88 QT Interval:  286 QTC Calculation: 414 R Axis:   38 Text Interpretation: Sinus tachycardia Probable anterior infarct, old Confirmed by Alvino Blood ([id number]) on 11/22/2021  5:24:05 PM         Imaging Studies ordered: I ordered imaging studies including *** On my interpretation imaging demonstrates *** I independently visualized and interpreted imaging. I agree with the radiologist interpretation   Medicines ordered and prescription drug management: Meds ordered this encounter  Medications   sodium chloride 0.9 % bolus 1,000 mL   acetaminophen (TYLENOL) tablet 1,000 mg   cefTRIAXone (ROCEPHIN) 1 g in sodium chloride 0.9 % 100 mL IVPB    Order Specific  Question:   Antibiotic Indication:    Answer:   UTI    -I have reviewed the patients home medicines and have made adjustments as needed   Consultations Obtained: I requested consultation with the ***,  and discussed lab and imaging findings as well as pertinent plan - they recommend: ***   Cardiac Monitoring: The patient was maintained on a cardiac monitor.  I personally viewed and interpreted the cardiac monitored which showed an underlying rhythm of: ***  Social Determinants of Health:  Factors impacting patients care include: {wssoc:28071}   Reevaluation: After the interventions noted above, I reevaluated the patient and found that they have {resolved/improved/worsened:23923::"improved"}  Co morbidities that complicate the patient evaluation  Past Medical History:  Diagnosis Date   Allergy       Dispostion: {wsdispo:28070}    Final Clinical Impression(s) / ED Diagnoses Final diagnoses:  None     This chart was dictated using voice recognition software.  Despite best efforts to proofread,  errors can occur which can change the documentation meaning.

## 2021-11-22 NOTE — ED Notes (Signed)
EDP and wife at [facility]. Discussing results and plan.

## 2021-11-22 NOTE — ED Provider Triage Note (Signed)
Emergency Medicine Provider Triage Evaluation Note  Erik Wheeler , a 61 y.o. male  was evaluated in triage.  Pt complains of generalized fatigue.  Patient states that symptoms began on Monday.  States that he felt tired which progressively worsened over the week.  States that then yesterday nausea and vomiting.  He went to urgent care and was also noted to have a heart rate up in the 140s and I started him on metoprolol.  States that he has also had intermittent subjective fever at home.  He denies any abdominal pain, diarrhea, dysuria, chest pain or shortness of breath or cough.  He states that 2 other people have asked if he was short of breath and so he thinks maybe he is but subjectively does not feel that way..  Review of Systems  Positive: See above Negative:   Physical Exam  BP 97/78 (BP Location: Left Arm)   Pulse 88   Temp (!) 102.2 F (39 C) (Oral)   Resp 16   SpO2 99%  Gen:   Awake, no distress   Resp:  Normal effort  MSK:   Moves extremities without difficulty  Other:  Not tachypneic.  Abdomen soft.  Medical Decision Making  Medically screening exam initiated at 1:20 PM.  Appropriate orders placed.  Erik Wheeler was informed that the remainder of the evaluation will be completed by another provider, this initial triage assessment does not replace that evaluation, and the importance of remaining in the ED until their evaluation is complete.     Erik Peru, PA-C 11/22/21 1321
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
## 2021-11-23 LAB — URINE CULTURE

## 2021-11-24 LAB — URINE CULTURE: Culture: >=100000 — AB

## 2021-11-25 ENCOUNTER — Telehealth (HOSPITAL_BASED_OUTPATIENT_CLINIC_OR_DEPARTMENT_OTHER): Payer: Self-pay | Admitting: *Deleted

## 2021-11-25 NOTE — Telephone Encounter (Signed)
Post ED Visit - Positive Culture Follow-up  Culture report reviewed by antimicrobial stewardship pharmacist: [facility] Team []  Elenor Quinones, Pharm.D. []  Heide Guile, Pharm.D., BCPS AQ-ID []  Parks Neptune, Pharm.D., BCPS []  Alycia Rossetti, Pharm.D., BCPS []  [place], Pharm.D., BCPS, AAHIVP []  Legrand Como, Pharm.D., BCPS, AAHIVP []  Salome Arnt, PharmD, BCPS []  Johnnette Gourd, PharmD, BCPS []  Hughes Better, PharmD, BCPS []  Leeroy Cha, PharmD []  Laqueta Linden, PharmD, BCPS []  Albertina Parr, PharmD  [place] Team []  Leodis Sias, PharmD []  Lindell Spar, PharmD [x]  Royetta Asal, PharmD []  Graylin Shiver, Rph []  Rema Fendt) Glennon Mac, PharmD []  Arlyn Dunning, PharmD []  Netta Cedars, PharmD []  Dia Sitter, PharmD []  Leone Haven, PharmD []  Gretta Arab, PharmD []  Theodis Shove, PharmD []  Peggyann Juba, PharmD []  Reuel Boom, PharmD   Positive urine culture Treated with Cefdinir, organism sensitive to the same and no further patient follow-up is required at this time.  Rosie Fate 11/25/2021, 1:18 PM

## 2022-11-07 DIAGNOSIS — Z125 Encounter for screening for malignant neoplasm of prostate: Secondary | ICD-10-CM | POA: Diagnosis not present

## 2022-11-07 DIAGNOSIS — E7800 Pure hypercholesterolemia, unspecified: Secondary | ICD-10-CM | POA: Diagnosis not present

## 2024-04-07 ENCOUNTER — Emergency Department (HOSPITAL_COMMUNITY)

## 2024-04-07 ENCOUNTER — Emergency Department (HOSPITAL_COMMUNITY)
Admission: EM | Admit: 2024-04-07 | Discharge: 2024-04-07 | Disposition: A | Discharge status: Home / Self Care | Source: Skilled Nursing Facility

## 2024-04-07 DIAGNOSIS — M25512 Pain in left shoulder: Secondary | ICD-10-CM | POA: Insufficient documentation

## 2024-04-07 DIAGNOSIS — R519 Headache, unspecified: Secondary | ICD-10-CM | POA: Insufficient documentation

## 2024-04-07 DIAGNOSIS — Y92410 Unspecified street and highway as the place of occurrence of the external cause: Secondary | ICD-10-CM | POA: Diagnosis not present

## 2024-04-07 MED ORDER — CYCLOBENZAPRINE HCL 10 MG PO TABS: 10.0000 mg | ORAL_TABLET | Freq: Two times a day (BID) | ORAL | 0 refills | Status: AC | PRN

## 2024-04-07 MED ORDER — LIDOCAINE 5 % EX PTCH
2.0000 | MEDICATED_PATCH | CUTANEOUS | Status: DC
  Administered 2024-04-07: 2 via TRANSDERMAL
  Filled 2024-04-07: qty 2

## 2024-04-07 MED ORDER — IBUPROFEN 200 MG PO TABS
600.0000 mg | ORAL_TABLET | Freq: Once | ORAL | Status: AC
  Administered 2024-04-07: 600 mg via ORAL
  Filled 2024-04-07: qty 3

## 2024-04-07 MED ORDER — LIDOCAINE 5 % EX PTCH: 1.0000 | MEDICATED_PATCH | CUTANEOUS | 0 refills | Status: AC
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
